# Patient Record
Sex: Male | Born: 1955
Health system: Southern US, Community
[De-identification: ages and names within clinical notes are randomized; demographics above are authoritative.]

## PROBLEM LIST (undated history)

## (undated) DIAGNOSIS — I82409 Acute embolism and thrombosis of unspecified deep veins of unspecified lower extremity: Secondary | ICD-10-CM

## (undated) DIAGNOSIS — K219 Gastro-esophageal reflux disease without esophagitis: Secondary | ICD-10-CM

## (undated) DIAGNOSIS — I10 Essential (primary) hypertension: Secondary | ICD-10-CM

## (undated) HISTORY — PX: KNEE SURGERY: SHX244

## (undated) HISTORY — PX: CARPAL TUNNEL RELEASE: SHX101

---

## 2003-06-17 ENCOUNTER — Encounter: Payer: Self-pay | Admitting: Family Medicine

## 2003-06-17 ENCOUNTER — Encounter: Admission: RE | Admit: 2003-06-17 | Discharge: 2003-06-17 | Payer: Self-pay | Admitting: Family Medicine

## 2005-06-24 ENCOUNTER — Ambulatory Visit (HOSPITAL_COMMUNITY): Admission: RE | Admit: 2005-06-24 | Discharge: 2005-06-24 | Payer: Self-pay | Admitting: Family Medicine

## 2008-03-15 ENCOUNTER — Encounter: Admission: RE | Admit: 2008-03-15 | Discharge: 2008-03-15 | Payer: Self-pay | Admitting: Family Medicine

## 2008-06-05 ENCOUNTER — Emergency Department: Payer: Self-pay | Admitting: Emergency Medicine

## 2009-01-01 ENCOUNTER — Emergency Department (HOSPITAL_COMMUNITY): Admission: EM | Admit: 2009-01-01 | Discharge: 2009-01-01 | Payer: Self-pay | Admitting: Emergency Medicine

## 2009-01-31 ENCOUNTER — Encounter: Admission: RE | Admit: 2009-01-31 | Discharge: 2009-01-31 | Payer: Self-pay | Admitting: Family Medicine

## 2009-02-14 ENCOUNTER — Encounter: Admission: RE | Admit: 2009-02-14 | Discharge: 2009-02-14 | Payer: Self-pay | Admitting: Family Medicine

## 2009-03-09 ENCOUNTER — Encounter: Admission: RE | Admit: 2009-03-09 | Discharge: 2009-03-09 | Payer: Self-pay | Admitting: Family Medicine

## 2009-12-25 ENCOUNTER — Encounter: Admission: RE | Admit: 2009-12-25 | Discharge: 2009-12-25 | Payer: Self-pay | Admitting: Family Medicine

## 2011-04-27 ENCOUNTER — Emergency Department: Payer: Self-pay | Admitting: *Deleted

## 2012-01-08 ENCOUNTER — Other Ambulatory Visit: Payer: Self-pay | Admitting: Family Medicine

## 2012-01-08 ENCOUNTER — Ambulatory Visit
Admission: RE | Admit: 2012-01-08 | Discharge: 2012-01-08 | Disposition: A | Discharge status: Home / Self Care | Payer: 59 | Source: Ambulatory Visit | Attending: Family Medicine | Admitting: Family Medicine

## 2012-01-08 DIAGNOSIS — M79606 Pain in leg, unspecified: Secondary | ICD-10-CM

## 2012-01-10 ENCOUNTER — Emergency Department (HOSPITAL_COMMUNITY)
Admission: EM | Admit: 2012-01-10 | Discharge: 2012-01-11 | Disposition: A | Discharge status: Home / Self Care | Payer: 59 | Attending: Emergency Medicine | Admitting: Emergency Medicine

## 2012-01-10 ENCOUNTER — Encounter (HOSPITAL_COMMUNITY): Payer: Self-pay | Admitting: Emergency Medicine

## 2012-01-10 DIAGNOSIS — M7989 Other specified soft tissue disorders: Secondary | ICD-10-CM | POA: Insufficient documentation

## 2012-01-10 DIAGNOSIS — I82409 Acute embolism and thrombosis of unspecified deep veins of unspecified lower extremity: Secondary | ICD-10-CM | POA: Insufficient documentation

## 2012-01-10 DIAGNOSIS — I10 Essential (primary) hypertension: Secondary | ICD-10-CM | POA: Insufficient documentation

## 2012-01-10 DIAGNOSIS — K219 Gastro-esophageal reflux disease without esophagitis: Secondary | ICD-10-CM | POA: Insufficient documentation

## 2012-01-10 HISTORY — DX: Gastro-esophageal reflux disease without esophagitis: K21.9

## 2012-01-10 HISTORY — DX: Essential (primary) hypertension: I10

## 2012-01-10 HISTORY — DX: Acute embolism and thrombosis of unspecified deep veins of unspecified lower extremity: I82.409

## 2012-01-10 MED ORDER — MORPHINE SULFATE 4 MG/ML IJ SOLN
8.0000 mg | Freq: Once | INTRAMUSCULAR | Status: AC
  Administered 2012-01-10: 8 mg via INTRAVENOUS
  Filled 2012-01-10: qty 2

## 2012-01-10 MED ORDER — ONDANSETRON HCL 4 MG/2ML IJ SOLN
4.0000 mg | Freq: Once | INTRAMUSCULAR | Status: AC
  Administered 2012-01-10: 4 mg via INTRAVENOUS
  Filled 2012-01-10: qty 2

## 2012-01-10 NOTE — ED Notes (Signed)
Diagnosed with R leg DVT on Wednesday.  Pt states he was started on Lovenox and Coumadin.  C/o increased pain and swelling to R leg today.  Pt had Vicodin at 3pm without relief.

## 2012-01-10 NOTE — Discharge Instructions (Signed)
Deep Vein Thrombosis A deep vein thrombosis (DVT) is a blood clot (thrombus) that develops in a deep vein. A DVT is a clot in the deep, larger veins of the leg, arm, or pelvis. These are more dangerous than clots that might form in veins on the surface of the body. Deep vein thrombosis can lead to complications if the clot breaks off and travels in the bloodstream to the lungs. CAUSES Blood clots form in a vein for different reasons. Usually several things cause blood clots. They include:  The flow of blood slows down.   The inside of the vein is damaged in some way.   The person has a condition that makes blood clot more easily. These conditions may include:   Older age (especially over 75 years old).   Having a history of blood clots.   Having major or lengthy surgery. Hip surgery is particularly high-risk.   Breaking a hip or leg.   Sitting or lying still for a long time.   Cancer or cancer treatment.   Having a long, thin tube (catheter) placed inside a vein during a medical procedure.   Being overweight (obese).   Pregnancy and childbirth.   Medicines with estrogen.   Smoking.   Other circulation or heart problems.  SYMPTOMS When a clot forms, it can either partially or totally block the blood flow in that vein. Symptoms of a DVT can include:  Swelling of the leg or arm, especially if one side is much worse.   Warmth and redness of the leg or arm, especially if one side is much worse.   Pain in an arm or leg. If the clot is in the leg, symptoms may be more noticeable or worse when standing or walking.  If the blood clot travels to the lung, it may cause:  Shortness of breath.   Chest pain. The pain may be worsened by deep breaths.   Coughing up thick mucus (phlegm), possibly flecked with blood.  Anyone with these symptoms should get emergency medical treatment right away. Call your local emergency services (911 in [place] if you have these symptoms. DIAGNOSIS If  a DVT is suspected, your caregiver will take a full medical history. He or she will also perform a physical exam. Tests that also may be required include:  Studies of the clotting properties of the blood.   An ultrasound scan.   X-rays to show the flow of blood when special dye is injected into the veins (venography).   Studies of your lungs if you have any chest symptoms.  PREVENTION  Exercise the legs regularly. Take a brisk 30 minute walk every day.   Maintain a weight that is appropriate for your height.   Avoid sitting or lying in bed for long periods of time without moving your legs.   Women, particularly those over the age of 35, should consider the risks and benefits of taking estrogen medicines, including birth control pills.   Do not smoke, especially if you take estrogen medicines.   Long-distance travel can increase your risk. You should exercise your legs by walking or pumping the muscles every hour.   In hospital prevention:   Prevention may include medical and nonmedical measures.  TREATMENT  The most common treatment for DVT is blood thinning (anticoagulant) medicine, which reduces the blood's tendency to clot. Anticoagulants can stop new blood clots from forming and old ones from growing. They cannot dissolve existing clots. Your body does this by itself over time.   Anticoagulants can be given by mouth, by intravenous (IV) access, or by injection. Your caregiver will determine the best program for you.   Less commonly, clot-dissolving drugs (thrombolytics) are used to dissolve a DVT. They carry a high risk of bleeding, so they are used mainly in severe cases.   Very rarely, a blood clot in the leg needs to be removed surgically.   If you are unable to take anticoagulants, your caregiver may arrange for you to have a filter placed in a main vein in your belly (abdomen). This filter prevents clots from traveling to your lungs.  HOME CARE INSTRUCTIONS  Take all  medicines prescribed by your caregiver. Follow the directions carefully.   You will most likely continue taking anticoagulants after you leave the hospital. Your caregiver will advise you on the length of treatment (usually 3 to 6 months, sometimes for life).   Taking too much or too little of an anticoagulant is dangerous. While taking this type of medicine, you will need to have regular blood tests to be sure the dose is correct. The dose can change for many reasons. It is critically important that you take this medicine exactly as prescribed, and that you have blood tests exactly as directed.   Many foods can interfere with anticoagulants. These include foods high in vitamin K, such as spinach, kale, broccoli, cabbage, collard and turnip greens, Brussels sprouts, peas, cauliflower, seaweed, parsley, beef and pork liver, green tea, and soybean oil. Your caregiver should discuss limits on these foods with you or you should arrange a visit with a dietician to answer your questions.   Many medicines can interfere with anticoagulants. You must tell your caregiver about any and all medicines you take. This includes all vitamins and supplements. Be especially cautious with aspirin and anti-inflammatory medicines. Ask your caregiver before taking these.   Anticoagulants can have side effects, mostly excessive bruising or bleeding. You will need to hold pressure over cuts for longer than usual. Avoid alcoholic drinks or consume only very small amounts while taking this medicine.   If you are taking an anticoagulant:   Wear a medical alert bracelet.   Notify your dentist or other caregivers before procedures.   Avoid contact sports.   Ask your caregiver how soon you can go back to normal activities. Not being active can lead to new clots. Ask for a list of what you should and should not do.   Exercise your lower leg muscles. This is important while traveling.   You may need to wear compression  stockings. These are tight elastic stockings that apply pressure to the lower legs. This can help keep the blood in the legs from clotting.   If you are a smoker, you should quit.   Learn as much as you can about DVT.  SEEK MEDICAL CARE IF:  You have unusual bruising or any bleeding problems.   The swelling or pain in your affected arm or leg is not gradually improving.   You anticipate surgery or long-distance travel. You should get specific advice on DVT prevention.   You discover other family members with blood clots. This may require further testing for inherited diseases or conditions.  SEEK IMMEDIATE MEDICAL CARE IF:  You develop chest pain.   You develop severe shortness of breath.   You begin to cough up bloody mucus or phlegm (sputum).   You feel dizzy or faint.   You develop swelling or pain in the leg.   You have   breathing problems after traveling.  MAKE SURE YOU:  Understand these instructions.   Will watch your condition.   Will get help right away if you are not doing well or get worse.  Document Released: 09/23/2005 Document Revised: 09/12/2011 Document Reviewed: 11/15/2010 [employer] Patient Information 2012 [employer]. 

## 2012-01-10 NOTE — ED Notes (Signed)
Pt c/o inc pain and swelling to RLE after being dx with DVT wed. States that pain meds are not working. R ankle appears to be most tender site on leg.

## 2012-01-10 NOTE — ED Provider Notes (Signed)
History     CSN: [id number]  Arrival date & time 01/10/12  1909   First MD Initiated Contact with Patient 01/10/12 2219      Chief Complaint  Patient presents with  . DVT    (Consider location/radiation/quality/duration/timing/severity/associated sxs/prior treatment) Patient is a 56 y.o. male presenting with leg pain. The history is provided by the patient.  Leg Pain  The incident occurred 2 days ago. The incident occurred at home. There was no injury mechanism. The pain is present in the right leg. The quality of the pain is described as throbbing. The pain is at a severity of 8/10. The pain is severe. Pertinent negatives include no numbness, no loss of sensation and no tingling. He reports no foreign bodies present. The symptoms are aggravated by activity and palpation. Treatments tried: vicodin. The treatment provided mild relief.    Past Medical History  Diagnosis Date  . DVT (deep venous thrombosis)   . Hypertension   . GERD (gastroesophageal reflux disease)     Past Surgical History  Procedure Date  . Carpal tunnel release   . Knee surgery     No family history on file.  History  Substance Use Topics  . Smoking status: Never Smoker   . Smokeless tobacco: Not on file  . Alcohol Use: No      Review of Systems  Constitutional: Negative for fever and chills.  HENT: Negative for congestion and rhinorrhea.   Respiratory: Negative for cough and shortness of breath.   Cardiovascular: Negative for chest pain and leg swelling.  Gastrointestinal: Negative for nausea, vomiting, abdominal pain, constipation and blood in stool.  Genitourinary: Negative for dysuria and decreased urine volume.  Musculoskeletal:       Right leg swelling and pain  Skin: Negative for color change, rash and wound.  Neurological: Negative for tingling, numbness and headaches.  Psychiatric/Behavioral: Negative for confusion.  All other systems reviewed and are negative.    Allergies    Review of patient's allergies indicates no known allergies.  Home Medications   Current Outpatient Rx  Name Route Sig Dispense Refill  . ENOXAPARIN SODIUM 120 MG/0.8ML [place] SOLN Subcutaneous Inject 120 mg into the skin every 12 (twelve) hours.    Marland Kitchen ESOMEPRAZOLE MAGNESIUM 40 MG PO CPDR Oral Take 40 mg by mouth daily before breakfast.    . HYDROCODONE-ACETAMINOPHEN 5-500 MG PO TABS Oral Take 1 tablet by mouth every 6 (six) hours as needed. For pain    . LISINOPRIL-HYDROCHLOROTHIAZIDE 20-25 MG PO TABS Oral Take 1 tablet by mouth every morning.    . WARFARIN SODIUM 5 MG PO TABS Oral Take 5-10 mg by mouth every evening. Take 2 tabs on Wed, Thur, Fri & 1 tab on Sat & Sun. Pt will have additional blood work done on Monday to determine future dosage of coumadin.      BP 119/82  Pulse 110  Temp(Src) 97.8 F (36.6 C) (Oral)  Resp 14  SpO2 94%  Physical Exam  Nursing note and vitals reviewed. Constitutional: He is oriented to person, place, and time. He appears well-developed and well-nourished.  HENT:  Head: Normocephalic and atraumatic.  Right Ear: External ear normal.  Left Ear: External ear normal.  Nose: Nose normal.  Neck: Neck supple.  Cardiovascular: Normal rate, regular rhythm, normal heart sounds and intact distal pulses.   Pulmonary/Chest: Effort normal and breath sounds normal. No respiratory distress. He has no wheezes. He has no rales.  Abdominal: Soft. He exhibits no distension  and no mass. There is no tenderness. There is no rebound and no guarding.  Musculoskeletal: He exhibits no edema.       Right lower leg: He exhibits tenderness and swelling (mild). He exhibits no edema, no deformity and no laceration.       Normal PT/DP pulses bilaterally  Lymphadenopathy:    He has no cervical adenopathy.  Neurological: He is alert and oriented to person, place, and time.  Skin: Skin is warm and dry.    ED Course  Procedures (including critical care time)   1. Right leg DVT        MDM  56 yo male presents with worsening leg pain and swelling. 2 days ago got outpatient U/S which showed right popliteal DVT. Put on lovenox and coumadin. Followed up today in clinic and had INR check and was 1.5. Since then had worsening leg pain. Denies dyspnea or chest pain. Pain is worse and 1 vicodin 8 hours ago did not help. Feels swelling is getting worse, and PCP sent in for re-ultrasound of leg. Doubt PE as patient is asymptomatic except for leg. Patient has been taking his lovenox BID as directed. Discussed with patient that we cannot get an U/S at this time of night, and that if he feels it's worsening we can schedule him for a repeat U/S tomorrow. No evidence of cellulitis or infection.         Pricilla Loveless, MD 01/10/12 [phone]

## 2012-01-11 ENCOUNTER — Ambulatory Visit (HOSPITAL_COMMUNITY)
Admission: EM | Admit: 2012-01-11 | Discharge: 2012-01-11 | Disposition: A | Discharge status: Home / Self Care | Payer: 59 | Source: Ambulatory Visit | Attending: Internal Medicine | Admitting: Internal Medicine

## 2012-01-11 ENCOUNTER — Ambulatory Visit (HOSPITAL_COMMUNITY)
Admission: RE | Admit: 2012-01-11 | Discharge: 2012-01-11 | Disposition: A | Discharge status: Home / Self Care | Payer: 59 | Source: Ambulatory Visit | Attending: Pediatrics | Admitting: Pediatrics

## 2012-01-11 ENCOUNTER — Inpatient Hospital Stay (HOSPITAL_COMMUNITY): Admission: RE | Admit: 2012-01-11 | Payer: 59 | Source: Ambulatory Visit

## 2012-01-11 DIAGNOSIS — M79609 Pain in unspecified limb: Secondary | ICD-10-CM

## 2012-01-11 DIAGNOSIS — R52 Pain, unspecified: Secondary | ICD-10-CM

## 2012-01-11 DIAGNOSIS — M7989 Other specified soft tissue disorders: Secondary | ICD-10-CM

## 2012-01-18 NOTE — ED Provider Notes (Signed)
I saw and evaluated the patient, reviewed the resident's note and I agree with the findings and plan.   .Face to face Exam:  General:  Awake HEENT:  Atraumatic Resp:  Normal effort Abd:  Nondistended Neuro:No focal weakness Lymph: No adenopathy I saw and evaluated the patient, reviewed the resident's note and I agree with the findings and plan.   .Face to face Exam:  General:  Awake HEENT:  Atraumatic Resp:  Normal effort Abd:  Nondistended Neuro:No focal weakness Lymph: No adenopathy   Nelia Shi, MD 01/18/12 1133

## 2012-05-06 ENCOUNTER — Other Ambulatory Visit: Payer: Self-pay | Admitting: Family Medicine

## 2012-05-06 DIAGNOSIS — R52 Pain, unspecified: Secondary | ICD-10-CM

## 2012-05-06 DIAGNOSIS — R609 Edema, unspecified: Secondary | ICD-10-CM

## 2012-05-07 ENCOUNTER — Other Ambulatory Visit: Payer: 59

## 2012-05-08 ENCOUNTER — Ambulatory Visit
Admission: RE | Admit: 2012-05-08 | Discharge: 2012-05-08 | Disposition: A | Discharge status: Home / Self Care | Payer: 59 | Source: Ambulatory Visit | Attending: Family Medicine | Admitting: Family Medicine

## 2012-05-08 DIAGNOSIS — R52 Pain, unspecified: Secondary | ICD-10-CM

## 2012-05-08 DIAGNOSIS — R609 Edema, unspecified: Secondary | ICD-10-CM

## 2012-12-31 ENCOUNTER — Emergency Department (HOSPITAL_COMMUNITY)
Admission: EM | Admit: 2012-12-31 | Discharge: 2013-01-01 | Disposition: A | Discharge status: Home / Self Care | Payer: 59 | Attending: Emergency Medicine | Admitting: Emergency Medicine

## 2012-12-31 ENCOUNTER — Encounter (HOSPITAL_COMMUNITY): Payer: Self-pay | Admitting: *Deleted

## 2012-12-31 DIAGNOSIS — B9789 Other viral agents as the cause of diseases classified elsewhere: Secondary | ICD-10-CM | POA: Insufficient documentation

## 2012-12-31 DIAGNOSIS — I10 Essential (primary) hypertension: Secondary | ICD-10-CM | POA: Insufficient documentation

## 2012-12-31 DIAGNOSIS — K219 Gastro-esophageal reflux disease without esophagitis: Secondary | ICD-10-CM | POA: Insufficient documentation

## 2012-12-31 DIAGNOSIS — Z7901 Long term (current) use of anticoagulants: Secondary | ICD-10-CM | POA: Insufficient documentation

## 2012-12-31 DIAGNOSIS — R197 Diarrhea, unspecified: Secondary | ICD-10-CM | POA: Insufficient documentation

## 2012-12-31 DIAGNOSIS — Z79899 Other long term (current) drug therapy: Secondary | ICD-10-CM | POA: Insufficient documentation

## 2012-12-31 DIAGNOSIS — R109 Unspecified abdominal pain: Secondary | ICD-10-CM | POA: Insufficient documentation

## 2012-12-31 DIAGNOSIS — Z86718 Personal history of other venous thrombosis and embolism: Secondary | ICD-10-CM | POA: Insufficient documentation

## 2012-12-31 DIAGNOSIS — R112 Nausea with vomiting, unspecified: Secondary | ICD-10-CM

## 2012-12-31 DIAGNOSIS — B349 Viral infection, unspecified: Secondary | ICD-10-CM

## 2012-12-31 NOTE — ED Notes (Signed)
Per EMS pt started having nausea around 1730, vomited x 1 around 2030, took phenergan at home, started having severe abdominal pain, thought had gas but when passed gas stool came out. BP 128/80, HR 60, 100% RA, RR 18.

## 2012-12-31 NOTE — ED Notes (Signed)
[id number]<JY> Expected date:<BR> Expected time:<BR> Means of arrival:<BR> Comments:<BR> EMS/56 yo male with vomiting-diffuse abdominal pain and bloating

## 2013-01-01 ENCOUNTER — Encounter (HOSPITAL_COMMUNITY): Payer: Self-pay | Admitting: Emergency Medicine

## 2013-01-01 LAB — COMPREHENSIVE METABOLIC PANEL
ALT: 40 U/L (ref 0–53)
AST: 29 U/L (ref 0–37)
Calcium: 8.7 mg/dL (ref 8.4–10.5)
GFR calc Af Amer: >90 mL/min (ref >90)
Glucose, Bld: 126 mg/dL — ABNORMAL HIGH (ref 70–99)
Sodium: 134 mEq/L — ABNORMAL LOW (ref 135–145)
Total Protein: 6.9 g/dL (ref 6.0–8.3)

## 2013-01-01 LAB — CBC WITH DIFFERENTIAL/PLATELET
Basophils Absolute: 0 10*3/uL (ref 0.0–0.1)
Eosinophils Absolute: 0 10*3/uL (ref 0.0–0.7)
Eosinophils Relative: 0 % (ref 0–5)
MCH: 27.6 pg (ref 26.0–34.0)
MCHC: 33.5 g/dL (ref 30.0–36.0)
MCV: 82.4 fL (ref 78.0–100.0)
Platelets: 182 10*3/uL (ref 150–400)
RDW: 13.6 % (ref 11.5–15.5)

## 2013-01-01 LAB — PROTIME-INR
INR: 1.92 — ABNORMAL HIGH (ref 0.00–1.49)
Prothrombin Time: 21.2 seconds — ABNORMAL HIGH (ref 11.6–15.2)

## 2013-01-01 MED ORDER — LOPERAMIDE HCL 2 MG PO CAPS
4.0000 mg | ORAL_CAPSULE | Freq: Once | ORAL | Status: AC
  Administered 2013-01-01: 4 mg via ORAL
  Filled 2013-01-01: qty 2

## 2013-01-01 MED ORDER — ONDANSETRON HCL 4 MG/2ML IJ SOLN
4.0000 mg | Freq: Once | INTRAMUSCULAR | Status: AC
  Administered 2013-01-01: 4 mg via INTRAVENOUS
  Filled 2013-01-01: qty 2

## 2013-01-01 MED ORDER — PANTOPRAZOLE SODIUM 40 MG IV SOLR
40.0000 mg | Freq: Once | INTRAVENOUS | Status: AC
  Administered 2013-01-01: 40 mg via INTRAVENOUS
  Filled 2013-01-01: qty 40

## 2013-01-01 MED ORDER — METOCLOPRAMIDE HCL 5 MG/ML IJ SOLN: 10.0000 mg | Freq: Once | INTRAMUSCULAR | Status: DC

## 2013-01-01 MED ORDER — SODIUM CHLORIDE 0.9 % IV BOLUS (SEPSIS)
1000.0000 mL | Freq: Once | INTRAVENOUS | Status: AC
  Administered 2013-01-01: 1000 mL via INTRAVENOUS

## 2013-01-01 MED ORDER — ONDANSETRON 8 MG PO TBDP: ORAL_TABLET | ORAL | Status: DC

## 2013-01-01 MED ORDER — GI COCKTAIL ~~LOC~~
30.0000 mL | Freq: Once | ORAL | Status: AC
  Administered 2013-01-01: 30 mL via ORAL
  Filled 2013-01-01: qty 30

## 2013-01-01 MED ORDER — ONDANSETRON 8 MG PO TBDP
8.0000 mg | ORAL_TABLET | Freq: Once | ORAL | Status: AC
  Administered 2013-01-01: 8 mg via ORAL
  Filled 2013-01-01: qty 1

## 2013-01-01 MED ORDER — PROMETHAZINE HCL 25 MG PO TABS: 25.0000 mg | ORAL_TABLET | Freq: Four times a day (QID) | ORAL | Status: DC | PRN

## 2013-01-01 NOTE — ED Provider Notes (Signed)
Medical screening examination/treatment/procedure(s) were performed by non-physician practitioner and as supervising physician I was immediately available for consultation/collaboration.   Rolan Bucco, MD 01/01/13 [phone]

## 2013-01-01 NOTE — ED Provider Notes (Signed)
History     CSN: [id number]  Arrival date & time 12/31/12  2313   First MD Initiated Contact with Patient 01/01/13 [phone]      Chief Complaint  Patient presents with  . Abdominal Pain  . Nausea  . Emesis  . Diarrhea   HPI  History provided by the patient and significant other. Patient is a 57 year old male with history of hypertension and lower extremity DVT currently on Coumadin who presents with acute onset nausea vomiting and diarrhea symptoms. He first started feeling slightly nauseated around 5:30 to 6 PM followed by episodes of vomiting. Patient did have a Phenergan at home which she took around 8:00 without significant relief. Symptoms were then followed by abdominal pains and cramping and patient came to the emergency room where he began having symptoms of watery diarrhea. While in the waiting room he had several trips to the bathroom with watery diarrhea without blood or mucus. Patient does report being a brown several friends with similar symptoms. He states last week that all went to a restaurant and 4 the friends came down with these symptoms. He did spend time driving with one of his friends who is sick 2 days ago. Denies any other known sick contacts. Denies any fever, chills or sweats. Denies any chest pain or shortness of breath. No other aggravating or alleviating factors. No other associated symptoms    Past Medical History  Diagnosis Date  . Hypertension   . DVT (deep venous thrombosis)   . GERD (gastroesophageal reflux disease)     Past Surgical History  Procedure Laterality Date  . Knee surgery      right    History reviewed. No pertinent family history.  History  Substance Use Topics  . Smoking status: Never Smoker   . Smokeless tobacco: Never Used  . Alcohol Use: Yes      Review of Systems  Constitutional: Negative for fever, chills and diaphoresis.  Respiratory: Negative for cough and shortness of breath.   Cardiovascular: Negative for chest pain.   Gastrointestinal: Positive for nausea, vomiting, abdominal pain and diarrhea. Negative for blood in stool.  Genitourinary: Negative for dysuria, hematuria and flank pain.    Allergies  Review of patient's allergies indicates no known allergies.  Home Medications   Current Outpatient Rx  Name  Route  Sig  Dispense  Refill  . atorvastatin (LIPITOR) 20 MG tablet   Oral   Take 20 mg by mouth daily.         Marland Kitchen esomeprazole (NEXIUM) 40 MG capsule   Oral   Take 40 mg by mouth daily before breakfast.         . lisinopril-hydrochlorothiazide (PRINZIDE,ZESTORETIC) 20-25 MG per tablet   Oral   Take 1 tablet by mouth daily.         . promethazine (PHENERGAN) 25 MG tablet   Oral   Take 25 mg by mouth every 6 (six) hours as needed for nausea. For nausea         . warfarin (COUMADIN) 5 MG tablet   Oral   Take 5-7.5 mg by mouth daily. Take 1.5 tablets on Monday, Wednesday, & Saturday Take 1 tablet all other days           BP 127/84  Pulse 94  Temp(Src) 97.9 F (36.6 C) (Oral)  Resp 18  SpO2 100%  Physical Exam  Nursing note and vitals reviewed. Constitutional: He is oriented to person, place, and time. He appears well-developed and well-nourished.  HENT:  Head: Normocephalic.  Cardiovascular: Normal rate and regular rhythm.   Pulmonary/Chest: Effort normal and breath sounds normal.  Abdominal: Soft.  Mild diffuse tenderness. No peritoneal signs  Neurological: He is alert and oriented to person, place, and time.  Skin: Skin is warm.  Psychiatric: He has a normal mood and affect. His behavior is normal.    ED Course  Procedures   Results for orders placed during the hospital encounter of 12/31/12  CBC WITH DIFFERENTIAL      Result Value Range   WBC 12.0 (*) 4.0 - 10.5 K/uL   RBC 5.86 (*) 4.22 - 5.81 MIL/uL   Hemoglobin 16.2  13.0 - 17.0 g/dL   HCT 47.4  25.9 - 56.3 %   MCV 82.4  78.0 - 100.0 fL   MCH 27.6  26.0 - 34.0 pg   MCHC 33.5  30.0 - 36.0 g/dL   RDW  87.5  64.3 - 32.9 %   Platelets 182  150 - 400 K/uL   Neutrophils Relative 92 (*) 43 - 77 %   Neutro Abs 11.1 (*) 1.7 - 7.7 K/uL   Lymphocytes Relative 4 (*) 12 - 46 %   Lymphs Abs 0.5 (*) 0.7 - 4.0 K/uL   Monocytes Relative 3  3 - 12 %   Monocytes Absolute 0.4  0.1 - 1.0 K/uL   Eosinophils Relative 0  0 - 5 %   Eosinophils Absolute 0.0  0.0 - 0.7 K/uL   Basophils Relative 0  0 - 1 %   Basophils Absolute 0.0  0.0 - 0.1 K/uL  COMPREHENSIVE METABOLIC PANEL      Result Value Range   Sodium 134 (*) 135 - 145 mEq/L   Potassium 4.3  3.5 - 5.1 mEq/L   Chloride 99  96 - 112 mEq/L   CO2 25  19 - 32 mEq/L   Glucose, Bld 126 (*) 70 - 99 mg/dL   BUN 21  6 - 23 mg/dL   Creatinine, Ser 5.18  0.50 - 1.35 mg/dL   Calcium 8.7  8.4 - 84.1 mg/dL   Total Protein 6.9  6.0 - 8.3 g/dL   Albumin 3.6  3.5 - 5.2 g/dL   AST 29  0 - 37 U/L   ALT 40  0 - 53 U/L   Alkaline Phosphatase 70  39 - 117 U/L   Total Bilirubin 1.8 (*) 0.3 - 1.2 mg/dL   GFR calc non Af Amer >90  >90 mL/min   GFR calc Af Amer >90  >90 mL/min  LIPASE, BLOOD      Result Value Range   Lipase 20  11 - 59 U/L  PROTIME-INR      Result Value Range   Prothrombin Time 21.2 (*) 11.6 - 15.2 seconds   INR 1.92 (*) 0.00 - 1.49        1. Viral syndrome   2. Nausea vomiting and diarrhea       MDM  1:20AM patient seen and evaluated. Patient returning from the bathroom does not appear in any acute distress.   Patient having some improvement of symptoms but occasionally still having diarrhea after Imodium. Patient was able to sit to tolerate some fluids.   Just before being discharged patient had additional episode of vomiting. I offered to restart an IV and give additional medicines. Patient states that episode of vomiting actually helped him feel slightly better. He will take ODT Zofran.  Patient resting for a bit longer now feeling more  improvement and wishes to return home. He will be given prescription for Zofran.   Angus Seller, PA-C 01/01/13 [phone]

## 2014-12-20 ENCOUNTER — Other Ambulatory Visit: Payer: Self-pay | Admitting: Family Medicine

## 2014-12-20 DIAGNOSIS — M5416 Radiculopathy, lumbar region: Secondary | ICD-10-CM

## 2014-12-26 ENCOUNTER — Ambulatory Visit
Admission: RE | Admit: 2014-12-26 | Discharge: 2014-12-26 | Disposition: A | Discharge status: Home / Self Care | Payer: PRIVATE HEALTH INSURANCE | Source: Ambulatory Visit | Attending: Family Medicine | Admitting: Family Medicine

## 2014-12-26 ENCOUNTER — Other Ambulatory Visit: Payer: Self-pay | Admitting: Family Medicine

## 2014-12-26 DIAGNOSIS — M5416 Radiculopathy, lumbar region: Secondary | ICD-10-CM

## 2014-12-26 MED ORDER — METHYLPREDNISOLONE ACETATE 40 MG/ML INJ SUSP (RADIOLOG
120.0000 mg | Freq: Once | INTRAMUSCULAR | Status: AC
  Administered 2014-12-26: 120 mg via EPIDURAL

## 2014-12-26 MED ORDER — IOHEXOL 180 MG/ML  SOLN
1.0000 mL | Freq: Once | INTRAMUSCULAR | Status: AC | PRN
  Administered 2014-12-26: 1 mL via EPIDURAL

## 2014-12-26 NOTE — Discharge Instructions (Addendum)

## 2015-01-09 ENCOUNTER — Other Ambulatory Visit: Payer: Self-pay | Admitting: Family Medicine

## 2015-01-09 DIAGNOSIS — M5416 Radiculopathy, lumbar region: Secondary | ICD-10-CM

## 2015-01-12 ENCOUNTER — Other Ambulatory Visit: Payer: Self-pay | Admitting: Family Medicine

## 2015-01-12 ENCOUNTER — Ambulatory Visit
Admission: RE | Admit: 2015-01-12 | Discharge: 2015-01-12 | Disposition: A | Discharge status: Home / Self Care | Payer: 59 | Source: Ambulatory Visit | Attending: Family Medicine | Admitting: Family Medicine

## 2015-01-12 DIAGNOSIS — M5416 Radiculopathy, lumbar region: Secondary | ICD-10-CM

## 2015-01-12 MED ORDER — IOHEXOL 180 MG/ML  SOLN
1.0000 mL | Freq: Once | INTRAMUSCULAR | Status: AC | PRN
  Administered 2015-01-12: 1 mL via EPIDURAL

## 2015-01-12 MED ORDER — METHYLPREDNISOLONE ACETATE 40 MG/ML INJ SUSP (RADIOLOG
120.0000 mg | Freq: Once | INTRAMUSCULAR | Status: AC
  Administered 2015-01-12: 120 mg via EPIDURAL

## 2015-01-12 NOTE — Discharge Instructions (Signed)

## 2015-07-19 ENCOUNTER — Other Ambulatory Visit: Payer: Self-pay | Admitting: Family Medicine

## 2015-07-19 DIAGNOSIS — M545 Low back pain, unspecified: Secondary | ICD-10-CM

## 2016-11-04 DIAGNOSIS — M25562 Pain in left knee: Secondary | ICD-10-CM | POA: Diagnosis not present

## 2016-11-06 DIAGNOSIS — M25562 Pain in left knee: Secondary | ICD-10-CM | POA: Diagnosis not present

## 2016-11-07 DIAGNOSIS — M25562 Pain in left knee: Secondary | ICD-10-CM | POA: Diagnosis not present

## 2016-11-13 DIAGNOSIS — H25013 Cortical age-related cataract, bilateral: Secondary | ICD-10-CM | POA: Diagnosis not present

## 2016-11-20 DIAGNOSIS — M6752 Plica syndrome, left knee: Secondary | ICD-10-CM | POA: Diagnosis not present

## 2016-11-20 DIAGNOSIS — S83282A Other tear of lateral meniscus, current injury, left knee, initial encounter: Secondary | ICD-10-CM | POA: Diagnosis not present

## 2016-11-20 DIAGNOSIS — G8918 Other acute postprocedural pain: Secondary | ICD-10-CM | POA: Diagnosis not present

## 2016-11-20 DIAGNOSIS — S83232A Complex tear of medial meniscus, current injury, left knee, initial encounter: Secondary | ICD-10-CM | POA: Diagnosis not present

## 2016-11-20 DIAGNOSIS — S83242A Other tear of medial meniscus, current injury, left knee, initial encounter: Secondary | ICD-10-CM | POA: Diagnosis not present

## 2016-11-24 ENCOUNTER — Encounter (HOSPITAL_COMMUNITY): Payer: Self-pay | Admitting: *Deleted

## 2016-11-24 ENCOUNTER — Emergency Department (HOSPITAL_COMMUNITY)
Admission: EM | Admit: 2016-11-24 | Discharge: 2016-11-24 | Disposition: A | Discharge status: Home / Self Care | Payer: 59 | Attending: Emergency Medicine | Admitting: Emergency Medicine

## 2016-11-24 ENCOUNTER — Emergency Department (HOSPITAL_BASED_OUTPATIENT_CLINIC_OR_DEPARTMENT_OTHER)
Admit: 2016-11-24 | Discharge: 2016-11-24 | Disposition: A | Discharge status: Home / Self Care | Payer: 59 | Attending: Emergency Medicine | Admitting: Emergency Medicine

## 2016-11-24 DIAGNOSIS — M79609 Pain in unspecified limb: Secondary | ICD-10-CM

## 2016-11-24 DIAGNOSIS — I10 Essential (primary) hypertension: Secondary | ICD-10-CM | POA: Diagnosis not present

## 2016-11-24 DIAGNOSIS — M7989 Other specified soft tissue disorders: Secondary | ICD-10-CM

## 2016-11-24 DIAGNOSIS — Z79899 Other long term (current) drug therapy: Secondary | ICD-10-CM | POA: Diagnosis not present

## 2016-11-24 DIAGNOSIS — M79662 Pain in left lower leg: Secondary | ICD-10-CM | POA: Diagnosis not present

## 2016-11-24 NOTE — ED Provider Notes (Signed)
[facility] DEPT Provider Note   CSN: [id number] Arrival date & time: 11/24/16  1108     History   Chief Complaint Chief Complaint  Patient presents with  . Leg Pain    HPI   Blood pressure (!) 138/110, pulse 87, temperature 97.6 F (36.4 C), temperature source Oral, resp. rate 16, SpO2 100 %.  Anthony Gray is a 61 y.o. male complaining of left posterior calf pain onset this morning. He is concerned that he has a DVT in that area. He had meniscal repair by Dr. French Ana 5 days ago. He has a history of prior DVT in the right lower extremity in the remote past and he is anticoagulated with Eliquis. He takes 2.5 mg twice a day and he has been compliant with this however he was taken off of the Eliquis for 72 hours for his arthroscopy. He restarted it 4 days ago. He states on the initial DVT the calf was swollen and red in he is denying any swelling or erythema now. He denies chest pain, palpitations, shortness of breath, syncope, hemoptysis.  Past Medical History:  Diagnosis Date  . DVT (deep venous thrombosis) ([place])   . GERD (gastroesophageal reflux disease)   . Hypertension     There are no active problems to display for this patient.   Past Surgical History:  Procedure Laterality Date  . CARPAL TUNNEL RELEASE    . KNEE SURGERY    . KNEE SURGERY     right       Home Medications    Prior to Admission medications   Medication Sig Start Date End Date Taking? Authorizing Provider  apixaban (ELIQUIS) 2.5 MG TABS tablet Take 2.5 mg by mouth 2 (two) times daily.    Historical Provider, MD  atorvastatin (LIPITOR) 20 MG tablet Take 20 mg by mouth daily.    Historical Provider, MD  esomeprazole (NEXIUM) 40 MG capsule Take 40 mg by mouth daily before breakfast.    Historical Provider, MD  HYDROcodone-acetaminophen (VICODIN) 5-500 MG per tablet Take 1 tablet by mouth every 6 (six) hours as needed. For pain    Historical Provider, MD  lisinopril-hydrochlorothiazide  (PRINZIDE,ZESTORETIC) 20-25 MG per tablet Take 1 tablet by mouth every morning.    Historical Provider, MD  ondansetron (ZOFRAN ODT) 8 MG disintegrating tablet 8mg  ODT q4 hours prn nausea 01/01/13   Hazel Sams, PA-C  promethazine (PHENERGAN) 25 MG tablet Take 25 mg by mouth every 6 (six) hours as needed for nausea. For nausea    Historical Provider, MD    Family History History reviewed. No pertinent family history.  Social History Social History  Substance Use Topics  . Smoking status: Never Smoker  . Smokeless tobacco: Never Used  . Alcohol use Yes     Allergies   Codeine   Review of Systems Review of Systems  10 systems reviewed and found to be negative, except as noted in the HPI.   Physical Exam Updated Vital Signs BP 120/92   Pulse 82   Temp 97.6 F (36.4 C) (Oral)   Resp 16   SpO2 95%   Physical Exam  Constitutional: He is oriented to person, place, and time. He appears well-developed and well-nourished. No distress.  HENT:  Head: Normocephalic and atraumatic.  Mouth/Throat: Oropharynx is clear and moist.  Eyes: Conjunctivae and EOM are normal. Pupils are equal, round, and reactive to light.  Neck: Normal range of motion.  Cardiovascular: Normal rate, regular rhythm and intact distal pulses.  Pulmonary/Chest: Effort normal and breath sounds normal.  Abdominal: Soft. There is no tenderness.  Musculoskeletal: Normal range of motion. He exhibits no edema or deformity.  Calf asymmetry, superficial collaterals, erythema or induration. Homans sign positive on the left side. Distally neurovascular intact.  Neurological: He is alert and oriented to person, place, and time. No sensory deficit.  Skin: He is not diaphoretic.  Psychiatric: He has a normal mood and affect.  Nursing note and vitals reviewed.    ED Treatments / Results  Labs (all labs ordered are listed, but only abnormal results are displayed) Labs Reviewed - No data to display  EKG  EKG  Interpretation None       Radiology No results found.  Procedures Procedures (including critical care time)  Medications Ordered in ED Medications - No data to display   Initial Impression / Assessment and Plan / ED Course  I have reviewed the triage vital signs and the nursing notes.  Pertinent labs & imaging results that were available during my care of the patient were reviewed by me and considered in my medical decision making (see chart for details).    Vitals:   11/24/16 1114 11/24/16 1145 11/24/16 1200  BP: (!) 138/110 143/97 120/92  Pulse: 87 76 82  Resp: 16    Temp: 97.6 F (36.4 C)    TempSrc: Oral    SpO2: 100% 96% 95%     Anthony Gray is 61 y.o. male presenting with Pain and left calf status post arthroscopy for meniscal repair 5 days ago he has a history of prior DVT. He is anticoagulated with Eliquis at a low dose. Patient is tender on the posterior left calf he is distally neurovascular intact with no swelling or erythema. Patient declines pain medication at this time. Venous duplex pending.  Venous duplex negative, patient encouraged to follow closely with orthopedist.  Evaluation does not show pathology that would require ongoing emergent intervention or inpatient treatment. Pt is hemodynamically stable and mentating appropriately. Discussed findings and plan with patient/guardian, who agrees with care plan. All questions answered. Return precautions discussed and outpatient follow up given.   Final Clinical Impressions(s) / ED Diagnoses   Final diagnoses:  Pain of left calf    New Prescriptions New Prescriptions   No medications on file     Monico Blitz, PA-C 11/24/16 [place], MD 11/24/16 1710

## 2016-11-24 NOTE — ED Triage Notes (Signed)
Pt reports having left knee surgery done on wed. Having left calf pain today and hx of blood clot.

## 2016-11-24 NOTE — ED Notes (Signed)
Patient transported to Ultrasound 

## 2016-11-24 NOTE — Discharge Instructions (Signed)
Please follow with your primary care doctor in the next 2 days for a check-up. They must obtain records for further management.  ° °Do not hesitate to return to the Emergency Department for any new, worsening or concerning symptoms.  ° °

## 2016-11-24 NOTE — Progress Notes (Signed)
VASCULAR LAB PRELIMINARY  PRELIMINARY  PRELIMINARY  PRELIMINARY  Left lower extremity venous duplex completed.    Preliminary report:  There is no DVT or SVT noted in the left lower extremity.  Called results to [facility], [facility] [place], RVT 11/24/2016, 12:43 PM

## 2016-11-25 DIAGNOSIS — M25562 Pain in left knee: Secondary | ICD-10-CM | POA: Diagnosis not present

## 2016-11-25 DIAGNOSIS — R531 Weakness: Secondary | ICD-10-CM | POA: Diagnosis not present

## 2016-11-25 DIAGNOSIS — S83242D Other tear of medial meniscus, current injury, left knee, subsequent encounter: Secondary | ICD-10-CM | POA: Diagnosis not present

## 2016-11-25 DIAGNOSIS — M1712 Unilateral primary osteoarthritis, left knee: Secondary | ICD-10-CM | POA: Diagnosis not present

## 2016-11-28 DIAGNOSIS — M25562 Pain in left knee: Secondary | ICD-10-CM | POA: Diagnosis not present

## 2016-11-28 DIAGNOSIS — S83242D Other tear of medial meniscus, current injury, left knee, subsequent encounter: Secondary | ICD-10-CM | POA: Diagnosis not present

## 2016-11-28 DIAGNOSIS — R531 Weakness: Secondary | ICD-10-CM | POA: Diagnosis not present

## 2016-12-03 DIAGNOSIS — S83242D Other tear of medial meniscus, current injury, left knee, subsequent encounter: Secondary | ICD-10-CM | POA: Diagnosis not present

## 2016-12-03 DIAGNOSIS — R531 Weakness: Secondary | ICD-10-CM | POA: Diagnosis not present

## 2016-12-03 DIAGNOSIS — M25562 Pain in left knee: Secondary | ICD-10-CM | POA: Diagnosis not present

## 2016-12-05 DIAGNOSIS — M25562 Pain in left knee: Secondary | ICD-10-CM | POA: Diagnosis not present

## 2016-12-05 DIAGNOSIS — R531 Weakness: Secondary | ICD-10-CM | POA: Diagnosis not present

## 2016-12-05 DIAGNOSIS — S83242D Other tear of medial meniscus, current injury, left knee, subsequent encounter: Secondary | ICD-10-CM | POA: Diagnosis not present

## 2016-12-06 DIAGNOSIS — D6851 Activated protein C resistance: Secondary | ICD-10-CM | POA: Diagnosis not present

## 2016-12-06 DIAGNOSIS — Z7901 Long term (current) use of anticoagulants: Secondary | ICD-10-CM | POA: Diagnosis not present

## 2016-12-06 DIAGNOSIS — I8290 Acute embolism and thrombosis of unspecified vein: Secondary | ICD-10-CM | POA: Diagnosis not present

## 2016-12-06 DIAGNOSIS — Z8672 Personal history of thrombophlebitis: Secondary | ICD-10-CM | POA: Diagnosis not present

## 2016-12-10 DIAGNOSIS — M25562 Pain in left knee: Secondary | ICD-10-CM | POA: Diagnosis not present

## 2016-12-10 DIAGNOSIS — R531 Weakness: Secondary | ICD-10-CM | POA: Diagnosis not present

## 2016-12-10 DIAGNOSIS — S83242D Other tear of medial meniscus, current injury, left knee, subsequent encounter: Secondary | ICD-10-CM | POA: Diagnosis not present

## 2016-12-12 DIAGNOSIS — R531 Weakness: Secondary | ICD-10-CM | POA: Diagnosis not present

## 2016-12-12 DIAGNOSIS — M25562 Pain in left knee: Secondary | ICD-10-CM | POA: Diagnosis not present

## 2016-12-12 DIAGNOSIS — S83242D Other tear of medial meniscus, current injury, left knee, subsequent encounter: Secondary | ICD-10-CM | POA: Diagnosis not present

## 2016-12-16 DIAGNOSIS — R531 Weakness: Secondary | ICD-10-CM | POA: Diagnosis not present

## 2016-12-16 DIAGNOSIS — M25562 Pain in left knee: Secondary | ICD-10-CM | POA: Diagnosis not present

## 2016-12-16 DIAGNOSIS — S83242D Other tear of medial meniscus, current injury, left knee, subsequent encounter: Secondary | ICD-10-CM | POA: Diagnosis not present

## 2016-12-18 DIAGNOSIS — M25562 Pain in left knee: Secondary | ICD-10-CM | POA: Diagnosis not present

## 2016-12-18 DIAGNOSIS — R531 Weakness: Secondary | ICD-10-CM | POA: Diagnosis not present

## 2016-12-18 DIAGNOSIS — S83242D Other tear of medial meniscus, current injury, left knee, subsequent encounter: Secondary | ICD-10-CM | POA: Diagnosis not present

## 2016-12-21 DIAGNOSIS — K5669 Other partial intestinal obstruction: Secondary | ICD-10-CM | POA: Diagnosis not present

## 2016-12-21 DIAGNOSIS — Z7902 Long term (current) use of antithrombotics/antiplatelets: Secondary | ICD-10-CM | POA: Diagnosis not present

## 2016-12-21 DIAGNOSIS — R110 Nausea: Secondary | ICD-10-CM | POA: Diagnosis not present

## 2016-12-21 DIAGNOSIS — R140 Abdominal distension (gaseous): Secondary | ICD-10-CM | POA: Diagnosis not present

## 2016-12-21 DIAGNOSIS — K56699 Other intestinal obstruction unspecified as to partial versus complete obstruction: Secondary | ICD-10-CM | POA: Diagnosis not present

## 2016-12-21 DIAGNOSIS — K56609 Unspecified intestinal obstruction, unspecified as to partial versus complete obstruction: Secondary | ICD-10-CM | POA: Diagnosis not present

## 2016-12-21 DIAGNOSIS — I10 Essential (primary) hypertension: Secondary | ICD-10-CM | POA: Diagnosis not present

## 2016-12-21 DIAGNOSIS — K529 Noninfective gastroenteritis and colitis, unspecified: Secondary | ICD-10-CM | POA: Diagnosis not present

## 2016-12-31 DIAGNOSIS — K529 Noninfective gastroenteritis and colitis, unspecified: Secondary | ICD-10-CM | POA: Diagnosis not present

## 2017-06-26 DIAGNOSIS — D225 Melanocytic nevi of trunk: Secondary | ICD-10-CM | POA: Diagnosis not present

## 2017-06-26 DIAGNOSIS — D485 Neoplasm of uncertain behavior of skin: Secondary | ICD-10-CM | POA: Diagnosis not present

## 2017-06-26 DIAGNOSIS — L821 Other seborrheic keratosis: Secondary | ICD-10-CM | POA: Diagnosis not present

## 2017-06-26 DIAGNOSIS — L814 Other melanin hyperpigmentation: Secondary | ICD-10-CM | POA: Diagnosis not present

## 2017-06-26 DIAGNOSIS — C44712 Basal cell carcinoma of skin of right lower limb, including hip: Secondary | ICD-10-CM | POA: Diagnosis not present

## 2017-06-26 DIAGNOSIS — C44519 Basal cell carcinoma of skin of other part of trunk: Secondary | ICD-10-CM | POA: Diagnosis not present

## 2017-06-26 DIAGNOSIS — D1801 Hemangioma of skin and subcutaneous tissue: Secondary | ICD-10-CM | POA: Diagnosis not present

## 2017-06-27 DIAGNOSIS — D6851 Activated protein C resistance: Secondary | ICD-10-CM | POA: Diagnosis not present

## 2017-06-27 DIAGNOSIS — Z86711 Personal history of pulmonary embolism: Secondary | ICD-10-CM | POA: Diagnosis not present

## 2017-06-27 DIAGNOSIS — Z7901 Long term (current) use of anticoagulants: Secondary | ICD-10-CM | POA: Diagnosis not present

## 2017-06-27 DIAGNOSIS — I8290 Acute embolism and thrombosis of unspecified vein: Secondary | ICD-10-CM | POA: Diagnosis not present

## 2017-07-17 DIAGNOSIS — J300 Vasomotor rhinitis: Secondary | ICD-10-CM | POA: Diagnosis not present

## 2017-07-17 DIAGNOSIS — J340 Abscess, furuncle and carbuncle of nose: Secondary | ICD-10-CM | POA: Diagnosis not present

## 2017-07-17 DIAGNOSIS — I10 Essential (primary) hypertension: Secondary | ICD-10-CM | POA: Diagnosis not present

## 2017-07-17 DIAGNOSIS — R7303 Prediabetes: Secondary | ICD-10-CM | POA: Diagnosis not present

## 2017-07-17 DIAGNOSIS — Z23 Encounter for immunization: Secondary | ICD-10-CM | POA: Diagnosis not present

## 2017-07-17 DIAGNOSIS — Z125 Encounter for screening for malignant neoplasm of prostate: Secondary | ICD-10-CM | POA: Diagnosis not present

## 2017-07-17 DIAGNOSIS — E782 Mixed hyperlipidemia: Secondary | ICD-10-CM | POA: Diagnosis not present

## 2017-07-19 DIAGNOSIS — J340 Abscess, furuncle and carbuncle of nose: Secondary | ICD-10-CM | POA: Diagnosis not present

## 2017-07-22 DIAGNOSIS — L03211 Cellulitis of face: Secondary | ICD-10-CM | POA: Diagnosis not present

## 2017-08-07 DIAGNOSIS — C44712 Basal cell carcinoma of skin of right lower limb, including hip: Secondary | ICD-10-CM | POA: Diagnosis not present

## 2017-11-14 DIAGNOSIS — H5203 Hypermetropia, bilateral: Secondary | ICD-10-CM | POA: Diagnosis not present

## 2017-12-09 DIAGNOSIS — L821 Other seborrheic keratosis: Secondary | ICD-10-CM | POA: Diagnosis not present

## 2017-12-09 DIAGNOSIS — D485 Neoplasm of uncertain behavior of skin: Secondary | ICD-10-CM | POA: Diagnosis not present

## 2017-12-09 DIAGNOSIS — C44712 Basal cell carcinoma of skin of right lower limb, including hip: Secondary | ICD-10-CM | POA: Diagnosis not present

## 2017-12-09 DIAGNOSIS — Z85828 Personal history of other malignant neoplasm of skin: Secondary | ICD-10-CM | POA: Diagnosis not present

## 2017-12-09 DIAGNOSIS — D1801 Hemangioma of skin and subcutaneous tissue: Secondary | ICD-10-CM | POA: Diagnosis not present

## 2017-12-12 DIAGNOSIS — D6851 Activated protein C resistance: Secondary | ICD-10-CM | POA: Diagnosis not present

## 2017-12-12 DIAGNOSIS — Z86711 Personal history of pulmonary embolism: Secondary | ICD-10-CM | POA: Diagnosis not present

## 2017-12-12 DIAGNOSIS — Z7901 Long term (current) use of anticoagulants: Secondary | ICD-10-CM | POA: Diagnosis not present

## 2017-12-12 DIAGNOSIS — Z86718 Personal history of other venous thrombosis and embolism: Secondary | ICD-10-CM | POA: Diagnosis not present

## 2018-01-05 DIAGNOSIS — J300 Vasomotor rhinitis: Secondary | ICD-10-CM | POA: Diagnosis not present

## 2018-02-09 DIAGNOSIS — C44712 Basal cell carcinoma of skin of right lower limb, including hip: Secondary | ICD-10-CM | POA: Diagnosis not present

## 2018-03-27 DIAGNOSIS — D485 Neoplasm of uncertain behavior of skin: Secondary | ICD-10-CM | POA: Diagnosis not present

## 2018-03-27 DIAGNOSIS — D225 Melanocytic nevi of trunk: Secondary | ICD-10-CM | POA: Diagnosis not present

## 2018-07-03 DIAGNOSIS — Z7901 Long term (current) use of anticoagulants: Secondary | ICD-10-CM | POA: Diagnosis not present

## 2018-07-03 DIAGNOSIS — D6851 Activated protein C resistance: Secondary | ICD-10-CM | POA: Diagnosis not present

## 2018-07-03 DIAGNOSIS — Z86718 Personal history of other venous thrombosis and embolism: Secondary | ICD-10-CM | POA: Diagnosis not present

## 2018-07-20 DIAGNOSIS — Z23 Encounter for immunization: Secondary | ICD-10-CM | POA: Diagnosis not present

## 2018-08-13 DIAGNOSIS — D6851 Activated protein C resistance: Secondary | ICD-10-CM | POA: Diagnosis not present

## 2018-08-13 DIAGNOSIS — I10 Essential (primary) hypertension: Secondary | ICD-10-CM | POA: Diagnosis not present

## 2018-08-13 DIAGNOSIS — Z1389 Encounter for screening for other disorder: Secondary | ICD-10-CM | POA: Diagnosis not present

## 2018-08-13 DIAGNOSIS — E7800 Pure hypercholesterolemia, unspecified: Secondary | ICD-10-CM | POA: Diagnosis not present

## 2018-08-13 DIAGNOSIS — R82998 Other abnormal findings in urine: Secondary | ICD-10-CM | POA: Diagnosis not present

## 2018-08-25 DIAGNOSIS — Z1212 Encounter for screening for malignant neoplasm of rectum: Secondary | ICD-10-CM | POA: Diagnosis not present

## 2018-11-18 DIAGNOSIS — H2513 Age-related nuclear cataract, bilateral: Secondary | ICD-10-CM | POA: Diagnosis not present

## 2019-10-11 ENCOUNTER — Ambulatory Visit: Payer: 59 | Attending: Internal Medicine

## 2019-10-11 DIAGNOSIS — Z20822 Contact with and (suspected) exposure to covid-19: Secondary | ICD-10-CM

## 2019-10-12 LAB — NOVEL CORONAVIRUS, NAA: SARS-CoV-2, NAA: NOT DETECTED

## 2020-05-03 ENCOUNTER — Ambulatory Visit
Admission: EM | Admit: 2020-05-03 | Discharge: 2020-05-03 | Disposition: A | Discharge status: Home / Self Care | Payer: 59 | Attending: Physician Assistant | Admitting: Physician Assistant

## 2020-05-03 ENCOUNTER — Ambulatory Visit (INDEPENDENT_AMBULATORY_CARE_PROVIDER_SITE_OTHER): Payer: 59

## 2020-05-03 ENCOUNTER — Other Ambulatory Visit: Payer: Self-pay

## 2020-05-03 DIAGNOSIS — M62838 Other muscle spasm: Secondary | ICD-10-CM

## 2020-05-03 DIAGNOSIS — M546 Pain in thoracic spine: Secondary | ICD-10-CM

## 2020-05-03 DIAGNOSIS — R0789 Other chest pain: Secondary | ICD-10-CM

## 2020-05-03 MED ORDER — TIZANIDINE HCL 2 MG PO TABS: 2.0000 mg | ORAL_TABLET | Freq: Three times a day (TID) | ORAL | 0 refills | Status: AC | PRN

## 2020-05-03 NOTE — ED Provider Notes (Signed)
[facility]    CSN: [id number] Arrival date & time: 05/03/20  1551      History   Chief Complaint Chief Complaint  Patient presents with  . Chest Pain    HPI Anthony Gray is a 64 y.o. male.   64 year old male with history of HTN, DVT on Eliquis, GERD comes in for 3-day history of left-sided muscle spasm, "weird feeling" to the left arm, and left upper back pain.  Denies injury/trauma.  Denies associated shortness of breath, nausea, diaphoresis.  States does not feel like chest pain, has muscle spasms to the pectoral muscle.  Spasm is intermittent, without obvious aggravating or alleviating factor.  Back pain is exacerbated by range of motion.  Denies exertional fatigue, exertional chest pain, dyspnea on exertion. Tylenol with temporary relief of symptoms.      Past Medical History:  Diagnosis Date  . DVT (deep venous thrombosis) ([place])   . GERD (gastroesophageal reflux disease)   . Hypertension     There are no problems to display for this patient.   Past Surgical History:  Procedure Laterality Date  . CARPAL TUNNEL RELEASE    . KNEE SURGERY    . KNEE SURGERY     right       Home Medications    Prior to Admission medications   Medication Sig Start Date End Date Taking? Authorizing Provider  apixaban (ELIQUIS) 2.5 MG TABS tablet Take 2.5 mg by mouth 2 (two) times daily.    [provider]  atorvastatin (LIPITOR) 20 MG tablet Take 20 mg by mouth daily.    [provider]  esomeprazole (NEXIUM) 40 MG capsule Take 40 mg by mouth daily before breakfast.    [provider]  lisinopril-hydrochlorothiazide (PRINZIDE,ZESTORETIC) 20-25 MG per tablet Take 1 tablet by mouth every morning.    [provider]  tiZANidine (ZANAFLEX) 2 MG tablet Take 1 tablet (2 mg total) by mouth every 8 (eight) hours as needed for muscle spasms. 05/03/20   Ok Edwards, PA-C    Family History History reviewed. No pertinent family  history.  Social History Social History   Tobacco Use  . Smoking status: Never Smoker  . Smokeless tobacco: Never Used  Substance Use Topics  . Alcohol use: Not Currently  . Drug use: No     Allergies   Codeine   Review of Systems Review of Systems  Reason unable to perform ROS: See HPI as above.     Physical Exam Triage Vital Signs ED Triage Vitals [05/03/20 1610]  Enc Vitals Group     BP (!) 130/86     Pulse Rate 95     Resp 18     Temp 98.3 F (36.8 C)     Temp Source Oral     SpO2 94 %     Weight      Height      Head Circumference      Peak Flow      Pain Score 3     Pain Loc      Pain Edu?      Excl. in [place]?    No data found.  Updated Vital Signs BP (!) 130/86 (BP Location: Left Arm)   Pulse 95   Temp 98.3 F (36.8 C) (Oral)   Resp 18   SpO2 94%   Physical Exam Constitutional:      General: He is not in acute distress.    Appearance: Normal appearance. He is  well-developed. He is not toxic-appearing or diaphoretic.  HENT:     Head: Normocephalic and atraumatic.  Eyes:     Conjunctiva/sclera: Conjunctivae normal.     Pupils: Pupils are equal, round, and reactive to light.  Cardiovascular:     Rate and Rhythm: Normal rate and regular rhythm.     Heart sounds: No murmur heard.  No friction rub. No gallop.   Pulmonary:     Effort: Pulmonary effort is normal. No respiratory distress.     Comments: LCTAB Chest:     Chest wall: No tenderness.     Comments: No current spasms, patient currently asymptomatic Musculoskeletal:     Cervical back: Normal range of motion and neck supple.     Comments: Pain to thoracic back is to the left mid paraspinal area, no reproducible by palpation. Reproducible with ROM of back. No tenderness to palpation of LUE. Full ROM of BUE. Strength 5/5. Sensation intact. Radial pulse 2+  Skin:    General: Skin is warm and dry.  Neurological:     Mental Status: He is alert and oriented to person, place, and time.       UC Treatments / Results  Labs (all labs ordered are listed, but only abnormal results are displayed) Labs Reviewed - No data to display  EKG   Radiology DG Chest 2 View  Result Date: 05/03/2020 CLINICAL DATA:  64 year old male with chest discomfort. EXAM: CHEST - 2 VIEW COMPARISON:  None. FINDINGS: No focal consolidation, pleural effusion, or pneumothorax. The cardiac silhouette is within limits. No acute osseous pathology. IMPRESSION: No active cardiopulmonary disease. Electronically Signed   By: Anner Crete M.D.   On: 05/03/2020 17:26    Procedures Procedures (including critical care time)  Medications Ordered in UC Medications - No data to display  Initial Impression / Assessment and Plan / UC Course  I have reviewed the triage vital signs and the nursing notes.  Pertinent labs & imaging results that were available during my care of the patient were reviewed by me and considered in my medical decision making (see chart for details).    EKG sinus rhythm with occasional PVC, 92bpm, nonspecific T wave changes, no prior EKG for comparison.  Patient without exertional chest pain, exertional fatigue, dyspnea on exertion, no suspicion for ACS at this time.  Patient requesting chest x-ray for symptoms.  X-ray negative for active cardiopulmonary disease.  Will treat symptomatically with tizanidine for muscle spasms.  Other symptomatic treatment discussed.  Return precautions given.  Otherwise follow-up with PCP for further evaluation if symptoms not improving.  Patient expresses understanding and agrees to plan.  Final Clinical Impressions(s) / UC Diagnoses   Final diagnoses:  Muscle spasm  Atypical chest pain  Acute left-sided thoracic back pain   ED Prescriptions    Medication Sig Dispense Auth. Provider   tiZANidine (ZANAFLEX) 2 MG tablet Take 1 tablet (2 mg total) by mouth every 8 (eight) hours as needed for muscle spasms. 15 tablet Ok Edwards, PA-C     PDMP not  reviewed this encounter.   Ok Edwards, PA-C 05/03/20 [place]

## 2020-05-03 NOTE — ED Triage Notes (Signed)
Pt c/o lt side chest muscle spasms with slight numbness to lt arm and lt upper back pain x3 day. Denies SOB/nausea/diaphroesis. States PCP sent him here for a EKG. EKG performed on arrival.

## 2020-05-03 NOTE — Discharge Instructions (Signed)
EKG without any alarming results.  Chest x-ray was normal.  Tizanidine as needed for muscle spasms.  Increase fluid intake for now.  Follow-up with PCP for further evaluation management if symptoms not improving.  If worsening symptoms, weakness, dizziness, shortness of breath, go to the emergency department for further evaluation.

## 2021-10-29 IMAGING — DX DG CHEST 2V
2 series · 2 of 2 positions shown · non-contrast
Comparison: None.

CLINICAL DATA: 64-year-old male with chest discomfort.

EXAM:
CHEST - 2 VIEW

[chest pa]
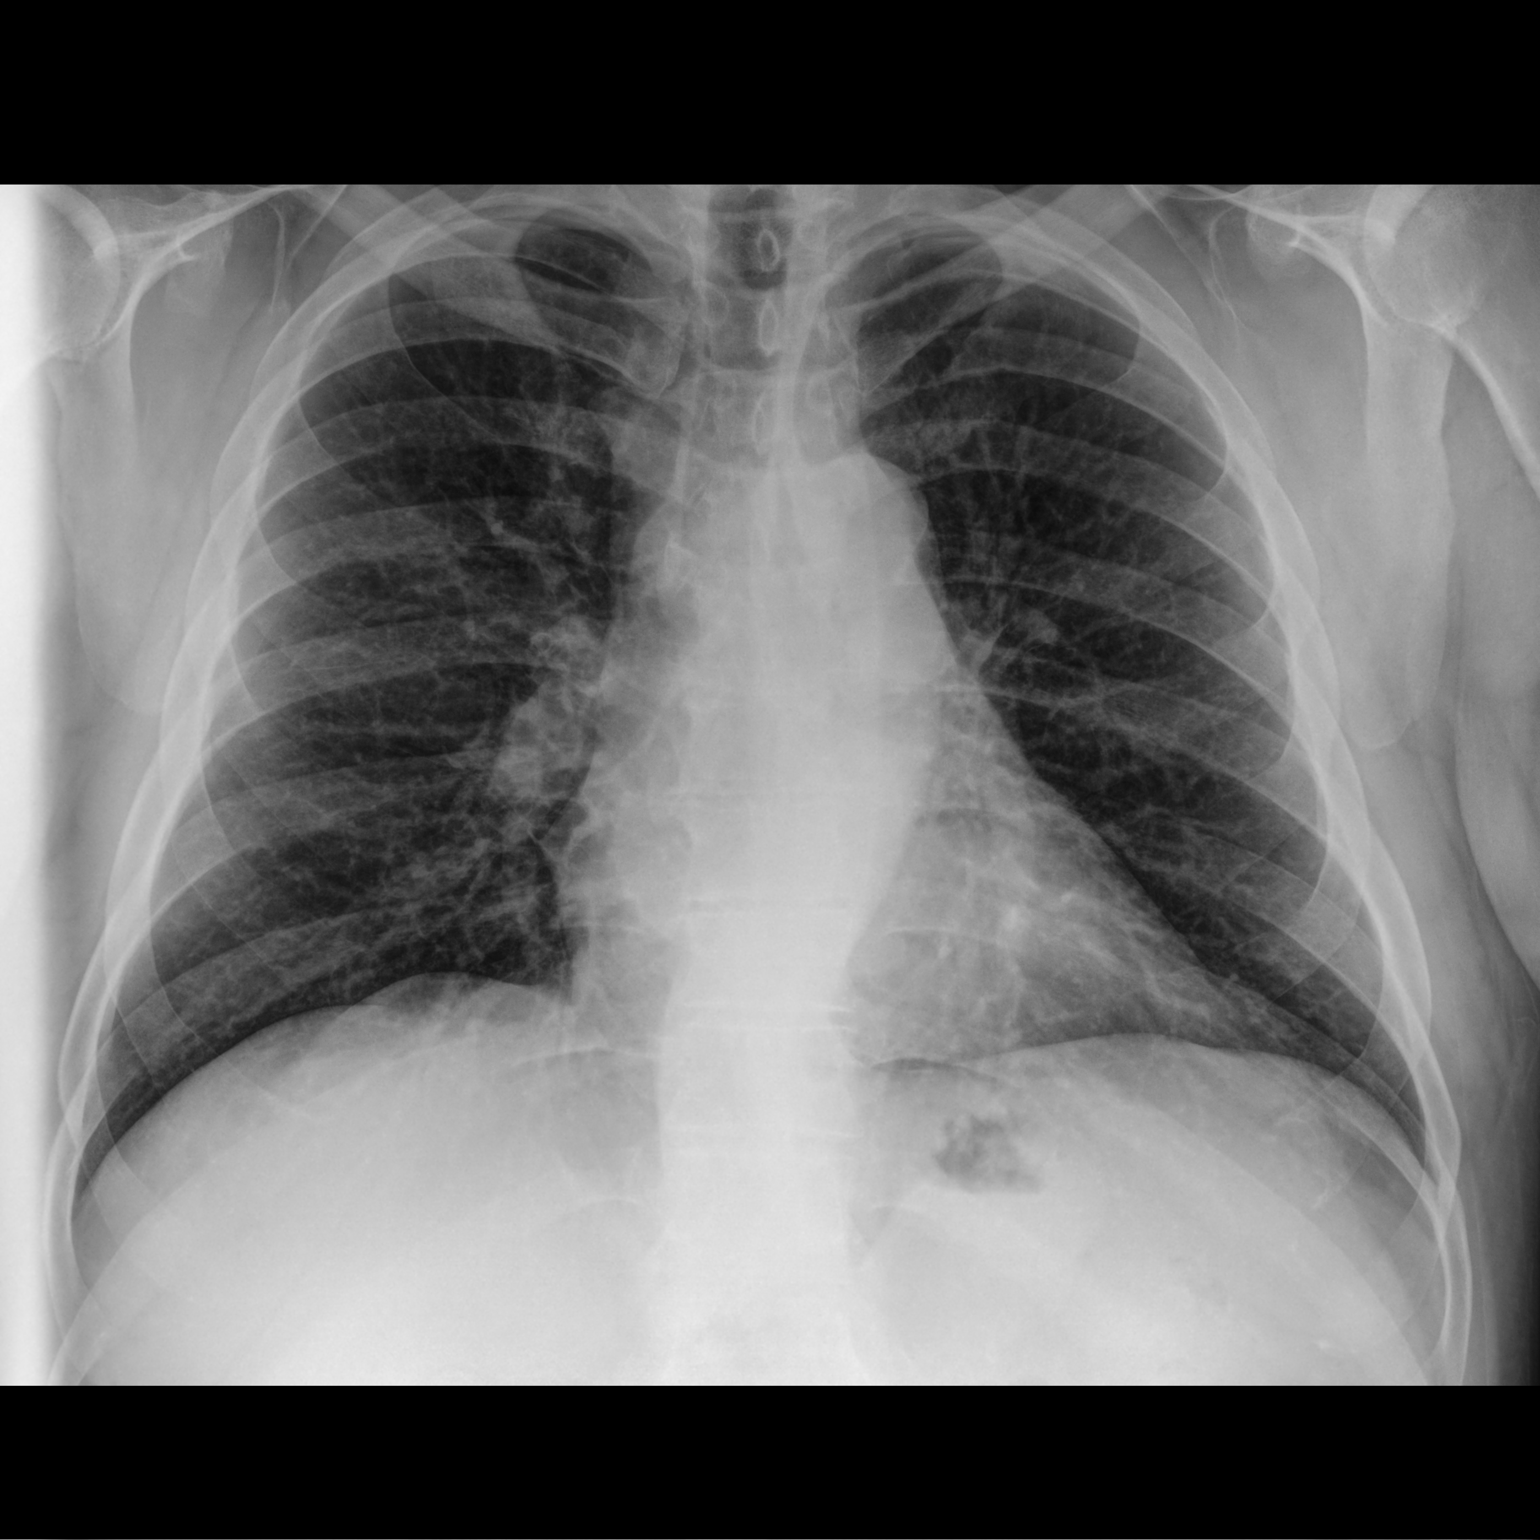

[chest lat]
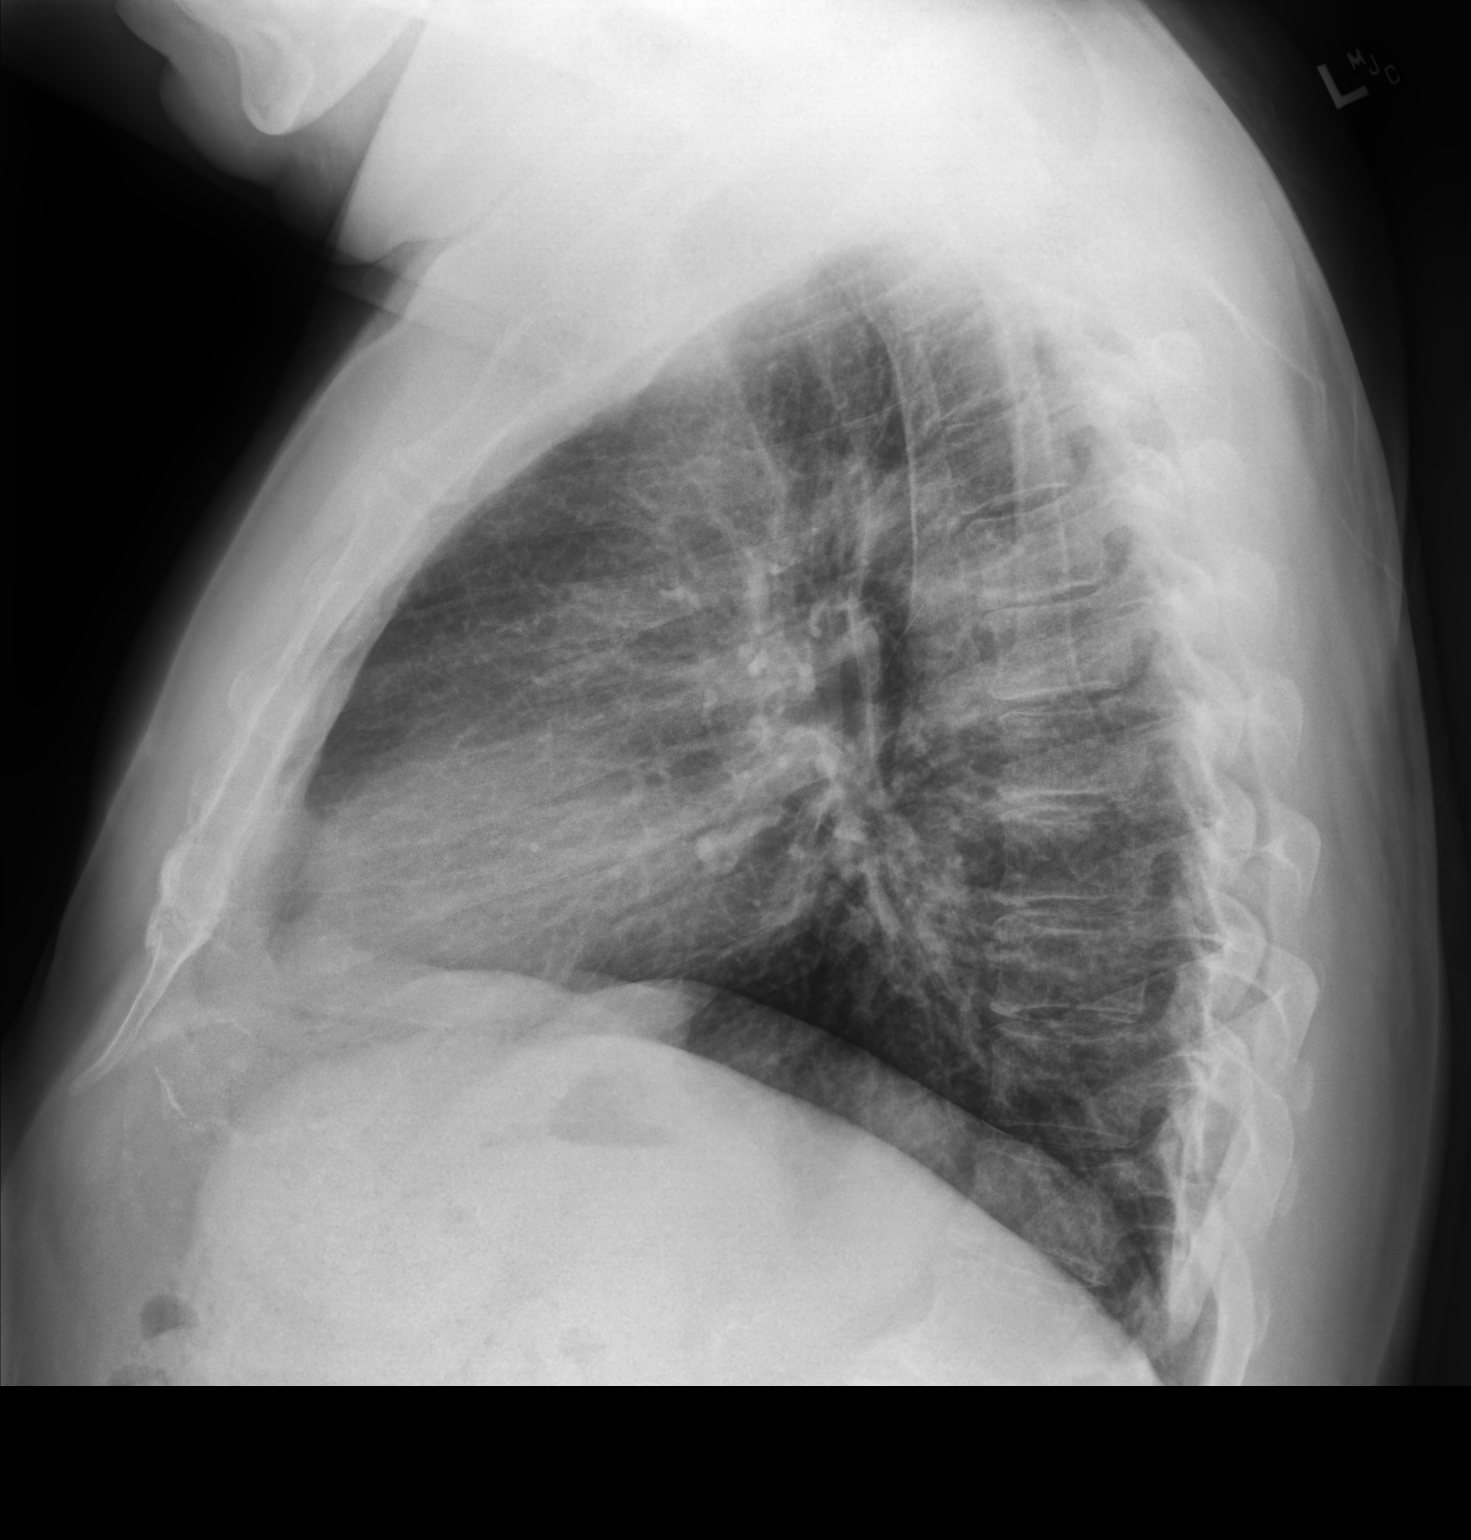

[2 of 2 positions shown; findings below may reference images not displayed]

FINDINGS: No focal consolidation, pleural effusion, or pneumothorax. The
cardiac silhouette is within limits. No acute osseous pathology.
IMPRESSION: No active cardiopulmonary disease.

## 2022-09-09 ENCOUNTER — Ambulatory Visit
Admission: EM | Admit: 2022-09-09 | Discharge: 2022-09-09 | Disposition: A | Discharge status: Home / Self Care | Payer: 59 | Attending: Physician Assistant | Admitting: Physician Assistant

## 2022-09-09 DIAGNOSIS — J0120 Acute ethmoidal sinusitis, unspecified: Secondary | ICD-10-CM

## 2022-09-09 MED ORDER — AMOXICILLIN 500 MG PO CAPS: 500.0000 mg | ORAL_CAPSULE | Freq: Three times a day (TID) | ORAL | 0 refills | Status: AC

## 2022-09-09 NOTE — Discharge Instructions (Signed)
Return if any problems.

## 2022-09-09 NOTE — ED Provider Notes (Signed)
[facility]    CSN: [id number] Arrival date & time: 09/09/22  0813      History   Chief Complaint Chief Complaint  Patient presents with   Cough    HPI Anthony Gray is a 66 y.o. male.   Pt complains of a sore throat and a cough for the past week.  Pt reports drainage from sinuses.  Pt reports he is losing his voice. No fever no bodyaches  The history is provided by the patient. No language interpreter was used.  Cough Cough characteristics:  Productive Severity:  Moderate Duration:  1 week Progression:  Worsening Chronicity:  New   Past Medical History:  Diagnosis Date   DVT (deep venous thrombosis) ([facility])    GERD (gastroesophageal reflux disease)    Hypertension     There are no problems to display for this patient.   Past Surgical History:  Procedure Laterality Date   CARPAL TUNNEL RELEASE     KNEE SURGERY     KNEE SURGERY     right       Home Medications    Prior to Admission medications   Medication Sig Start Date End Date Taking? Authorizing Provider  apixaban (ELIQUIS) 2.5 MG TABS tablet Take 2.5 mg by mouth 2 (two) times daily.    [provider]  atorvastatin (LIPITOR) 20 MG tablet Take 20 mg by mouth daily.    [provider]  esomeprazole (NEXIUM) 40 MG capsule Take 40 mg by mouth daily before breakfast.    [provider]  lisinopril-hydrochlorothiazide (PRINZIDE,ZESTORETIC) 20-25 MG per tablet Take 1 tablet by mouth every morning.    [provider]  tiZANidine (ZANAFLEX) 2 MG tablet Take 1 tablet (2 mg total) by mouth every 8 (eight) hours as needed for muscle spasms. 05/03/20   Ok Edwards, PA-C    Family History History reviewed. No pertinent family history.  Social History Social History   Tobacco Use   Smoking status: Never   Smokeless tobacco: Never  Substance Use Topics   Alcohol use: Not Currently   Drug use: No     Allergies   Codeine   Review of Systems Review of  Systems  Respiratory:  Positive for cough.   All other systems reviewed and are negative.    Physical Exam Triage Vital Signs ED Triage Vitals [09/09/22 0821]  Enc Vitals Group     BP 134/89     Pulse Rate 75     Resp 16     Temp 97.6 F (36.4 C)     Temp Source Oral     SpO2 97 %     Weight      Height      Head Circumference      Peak Flow      Pain Score 0     Pain Loc      Pain Edu?      Excl. in [place]?    No data found.  Updated Vital Signs BP 134/89 (BP Location: Left Arm)   Pulse 75   Temp 97.6 F (36.4 C) (Oral)   Resp 16   SpO2 97%   Visual Acuity Right Eye Distance:   Left Eye Distance:   Bilateral Distance:    Right Eye Near:   Left Eye Near:    Bilateral Near:     Physical Exam Vitals and nursing note reviewed.  Constitutional:      Appearance: He is well-developed.  HENT:  Head: Normocephalic.     Right Ear: Tympanic membrane normal.     Left Ear: Tympanic membrane normal.     Nose: Nose normal.     Mouth/Throat:     Mouth: Mucous membranes are moist.  Eyes:     Pupils: Pupils are equal, round, and reactive to light.  Cardiovascular:     Rate and Rhythm: Normal rate.  Pulmonary:     Effort: Pulmonary effort is normal.  Abdominal:     General: There is no distension.  Musculoskeletal:        General: Normal range of motion.     Cervical back: Normal range of motion.  Neurological:     Mental Status: He is alert and oriented to person, place, and time.      UC Treatments / Results  Labs (all labs ordered are listed, but only abnormal results are displayed) Labs Reviewed - No data to display  EKG   Radiology No results found.  Procedures Procedures (including critical care time)  Medications Ordered in UC Medications - No data to display  Initial Impression / Assessment and Plan / UC Course  I have reviewed the triage vital signs and the nursing notes.  Pertinent labs & imaging results that were available during my  care of the patient were reviewed by me and considered in my medical decision making (see chart for details).     Mdm:  Pt seems to have sinus infection.  I will tretwith aoxicillian An After Visit Summary was printed and given to the patient.     Final Clinical Impressions(s) / UC Diagnoses   Final diagnoses:  Acute ethmoidal sinusitis, recurrence not specified     Discharge Instructions      Return if any problems   ED Prescriptions   None    PDMP not reviewed this encounter.   Fransico Meadow, [place] 09/09/22 [phone]

## 2022-09-09 NOTE — ED Triage Notes (Signed)
Pt c/o cough, sore throat, hoarseness, worse in morning, onset ~ 1 week ago.
